# Patient Record
Sex: Male | Born: 1990 | Race: White | Hispanic: No | Marital: Single | Smoking: Never smoker
Health system: Southern US, Community
[De-identification: ages and names within clinical notes are randomized; demographics above are authoritative.]

## PROBLEM LIST (undated history)

## (undated) DIAGNOSIS — F909 Attention-deficit hyperactivity disorder, unspecified type: Secondary | ICD-10-CM

---
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
## 2012-10-10 DIAGNOSIS — I8390 Asymptomatic varicose veins of unspecified lower extremity: Secondary | ICD-10-CM | POA: Insufficient documentation

## 2012-11-01 ENCOUNTER — Emergency Department (HOSPITAL_BASED_OUTPATIENT_CLINIC_OR_DEPARTMENT_OTHER)
Admission: EM | Admit: 2012-11-01 | Discharge: 2012-11-02 | Disposition: A | Discharge status: Home / Self Care | Payer: BC Managed Care – PPO | Attending: Emergency Medicine | Admitting: Emergency Medicine

## 2012-11-01 ENCOUNTER — Encounter (HOSPITAL_BASED_OUTPATIENT_CLINIC_OR_DEPARTMENT_OTHER): Payer: Self-pay | Admitting: Emergency Medicine

## 2012-11-01 DIAGNOSIS — R059 Cough, unspecified: Secondary | ICD-10-CM | POA: Insufficient documentation

## 2012-11-01 DIAGNOSIS — R05 Cough: Secondary | ICD-10-CM | POA: Insufficient documentation

## 2012-11-01 DIAGNOSIS — Z8659 Personal history of other mental and behavioral disorders: Secondary | ICD-10-CM | POA: Insufficient documentation

## 2012-11-01 DIAGNOSIS — J029 Acute pharyngitis, unspecified: Secondary | ICD-10-CM | POA: Insufficient documentation

## 2012-11-01 DIAGNOSIS — J020 Streptococcal pharyngitis: Secondary | ICD-10-CM | POA: Insufficient documentation

## 2012-11-01 HISTORY — DX: Attention-deficit hyperactivity disorder, unspecified type: F90.9

## 2012-11-01 MED ORDER — IBUPROFEN 800 MG PO TABS
800.0000 mg | ORAL_TABLET | Freq: Once | ORAL | Status: AC
  Administered 2012-11-01: 800 mg via ORAL
  Filled 2012-11-01: qty 1

## 2012-11-01 NOTE — ED Notes (Signed)
Cough x6 days.  Fever x3 days.  Cough started while in [place].

## 2012-11-01 NOTE — ED Notes (Signed)
Pt reports non-productive cough x6 days, sore throat and fever x3 days, pt concerned d/t recently traveling to [place]. Pt has been using daquil and ibuprofen at home for fever w/o relief - last dose of ibuprofen was this a.m. Pt is A&Ox4 in no acute distress.

## 2012-11-02 ENCOUNTER — Emergency Department (HOSPITAL_BASED_OUTPATIENT_CLINIC_OR_DEPARTMENT_OTHER): Payer: BC Managed Care – PPO

## 2012-11-02 MED ORDER — BENZONATATE 100 MG PO CAPS: 100.0000 mg | ORAL_CAPSULE | Freq: Three times a day (TID) | ORAL | Status: DC

## 2012-11-02 MED ORDER — IBUPROFEN 800 MG PO TABS: 800.0000 mg | ORAL_TABLET | Freq: Three times a day (TID) | ORAL | Status: DC

## 2012-11-02 MED ORDER — PENICILLIN G BENZATHINE 1200000 UNIT/2ML IM SUSP
1.2000 10*6.[IU] | Freq: Once | INTRAMUSCULAR | Status: AC
  Administered 2012-11-02: 1.2 10*6.[IU] via INTRAMUSCULAR
  Filled 2012-11-02: qty 2

## 2012-11-02 NOTE — ED Notes (Signed)
Patient transported to X-ray 

## 2012-11-02 NOTE — ED Notes (Signed)
Pt ambulating independently w/ steady gait on d/c in no acute distress, A&Ox4. D/c instructions reviewed w/ pt and family - pt and family deny any further questions or concerns at present. Rx given x2  

## 2012-11-02 NOTE — ED Provider Notes (Signed)
  CSN: [id number]     Arrival date & time 11/01/12  2333 History     First MD Initiated Contact with Patient 11/01/12 2343     Chief Complaint  Patient presents with  . Cough  . Fever   (Consider location/radiation/quality/duration/timing/severity/associated sxs/prior Treatment) Patient is a 22 y.o. male presenting with cough and fever. The history is provided by the patient.  Cough Cough characteristics:  Non-productive Severity:  Mild Onset quality:  Gradual Timing:  Constant Progression:  Unchanged Chronicity:  New Context: sick contacts   Relieved by:  Nothing Worsened by:  Nothing tried Ineffective treatments:  None tried Associated symptoms: fever and sore throat   Sore throat:    Severity:  Moderate   Onset quality:  Gradual   Timing:  Constant   Progression:  Unchanged Risk factors: no recent infection   Fever Associated symptoms: cough and sore throat     Past Medical History  Diagnosis Date  . ADHD (attention deficit hyperactivity disorder)    History reviewed. No pertinent past surgical history. No family history on file. History  Substance Use Topics  . Smoking status: Never Smoker   . Smokeless tobacco: Not on file  . Alcohol Use: 0.0 oz/week     Comment: Beer couple times a week    Review of Systems  Constitutional: Positive for fever.  HENT: Positive for sore throat. Negative for drooling, trouble swallowing and voice change.   Respiratory: Positive for cough.   All other systems reviewed and are negative.    Allergies  Review of patient's allergies indicates no known allergies.  Home Medications  No current outpatient prescriptions on file. BP 116/87  Pulse 118  Temp(Src) 103 F (39.4 C) (Oral)  Resp 24  Ht 6\' 3"  (1.905 m)  Wt 185 lb (83.915 kg)  BMI 23.12 kg/m2  SpO2 99% Physical Exam  Constitutional: He is oriented to person, place, and time. He appears well-developed and well-nourished. No distress.  HENT:  Head: Normocephalic  and atraumatic.  Mouth/Throat: Oropharynx is clear and moist. No oropharyngeal exudate.  Eyes: Conjunctivae are normal. Pupils are equal, round, and reactive to light.  Neck: Normal range of motion. Neck supple.  Cardiovascular: Normal rate and regular rhythm.   Pulmonary/Chest: Effort normal and breath sounds normal. He has no wheezes. He has no rales.  Abdominal: Soft. Bowel sounds are normal. There is no tenderness. There is no rebound and no guarding.  Musculoskeletal: Normal range of motion.  Lymphadenopathy:    He has no cervical adenopathy.  Neurological: He is alert and oriented to person, place, and time.  Skin: Skin is warm and dry.  Psychiatric: He has a normal mood and affect.    ED Course   Procedures (including critical care time)  Labs Reviewed  RAPID STREP SCREEN - Abnormal; Notable for the following:    Streptococcus, Group A Screen (Direct) POSITIVE (*)    All other components within normal limits   No results found. No diagnosis found.  MDM  Will treat for strep  Briellah Baik K Jeury Mcnab-Rasch, MD 11/02/12 6578

## 2014-11-09 IMAGING — CR DG CHEST 2V
2 series · 2 of 2 positions shown · non-contrast
Comparison: 10/27/2005

CLINICAL DATA: Cough.  Fever [DATE].  Chest pain.  Recent return
[place] [facility] [employer].

CHEST - 2 VIEW

[w chest pa]
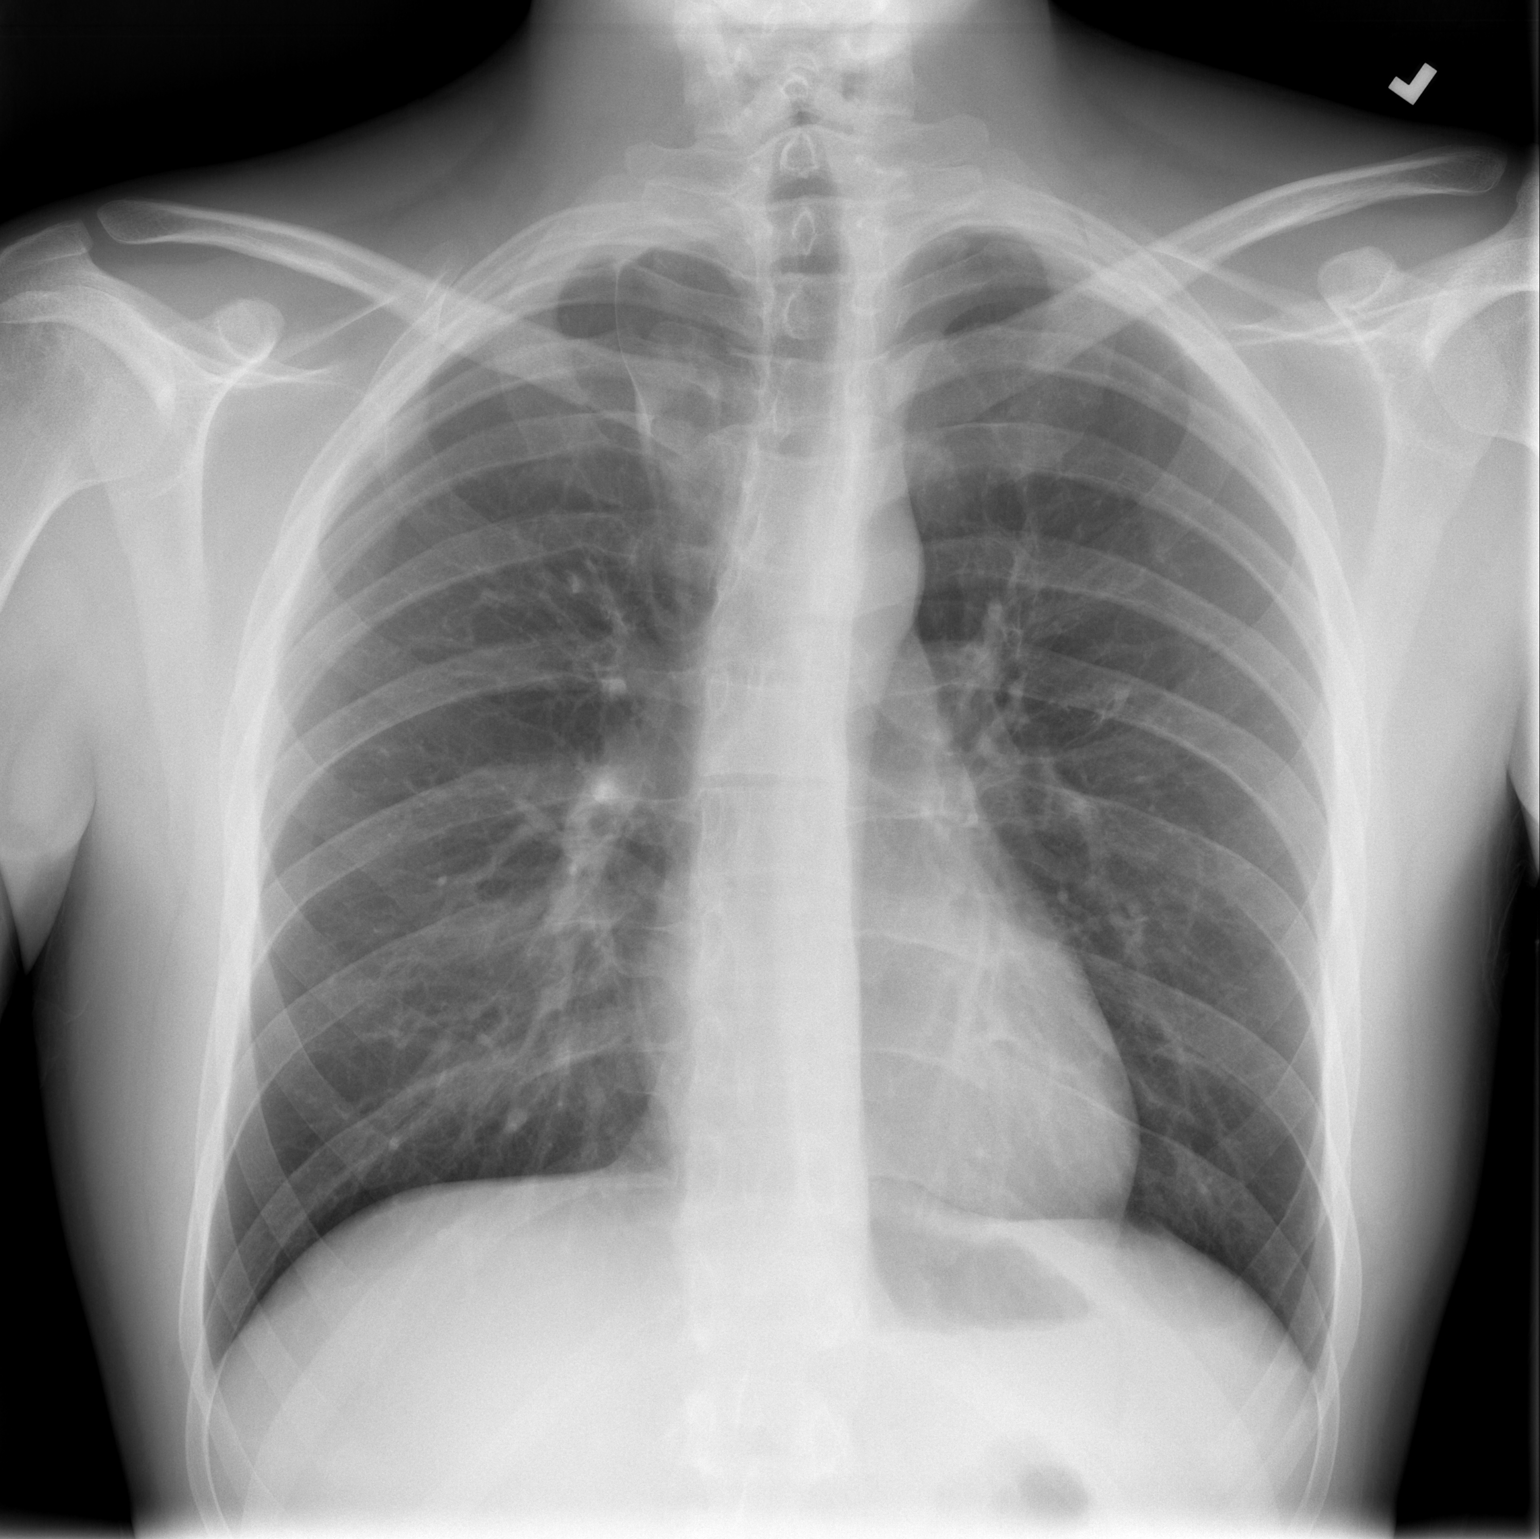

[w chest lat]
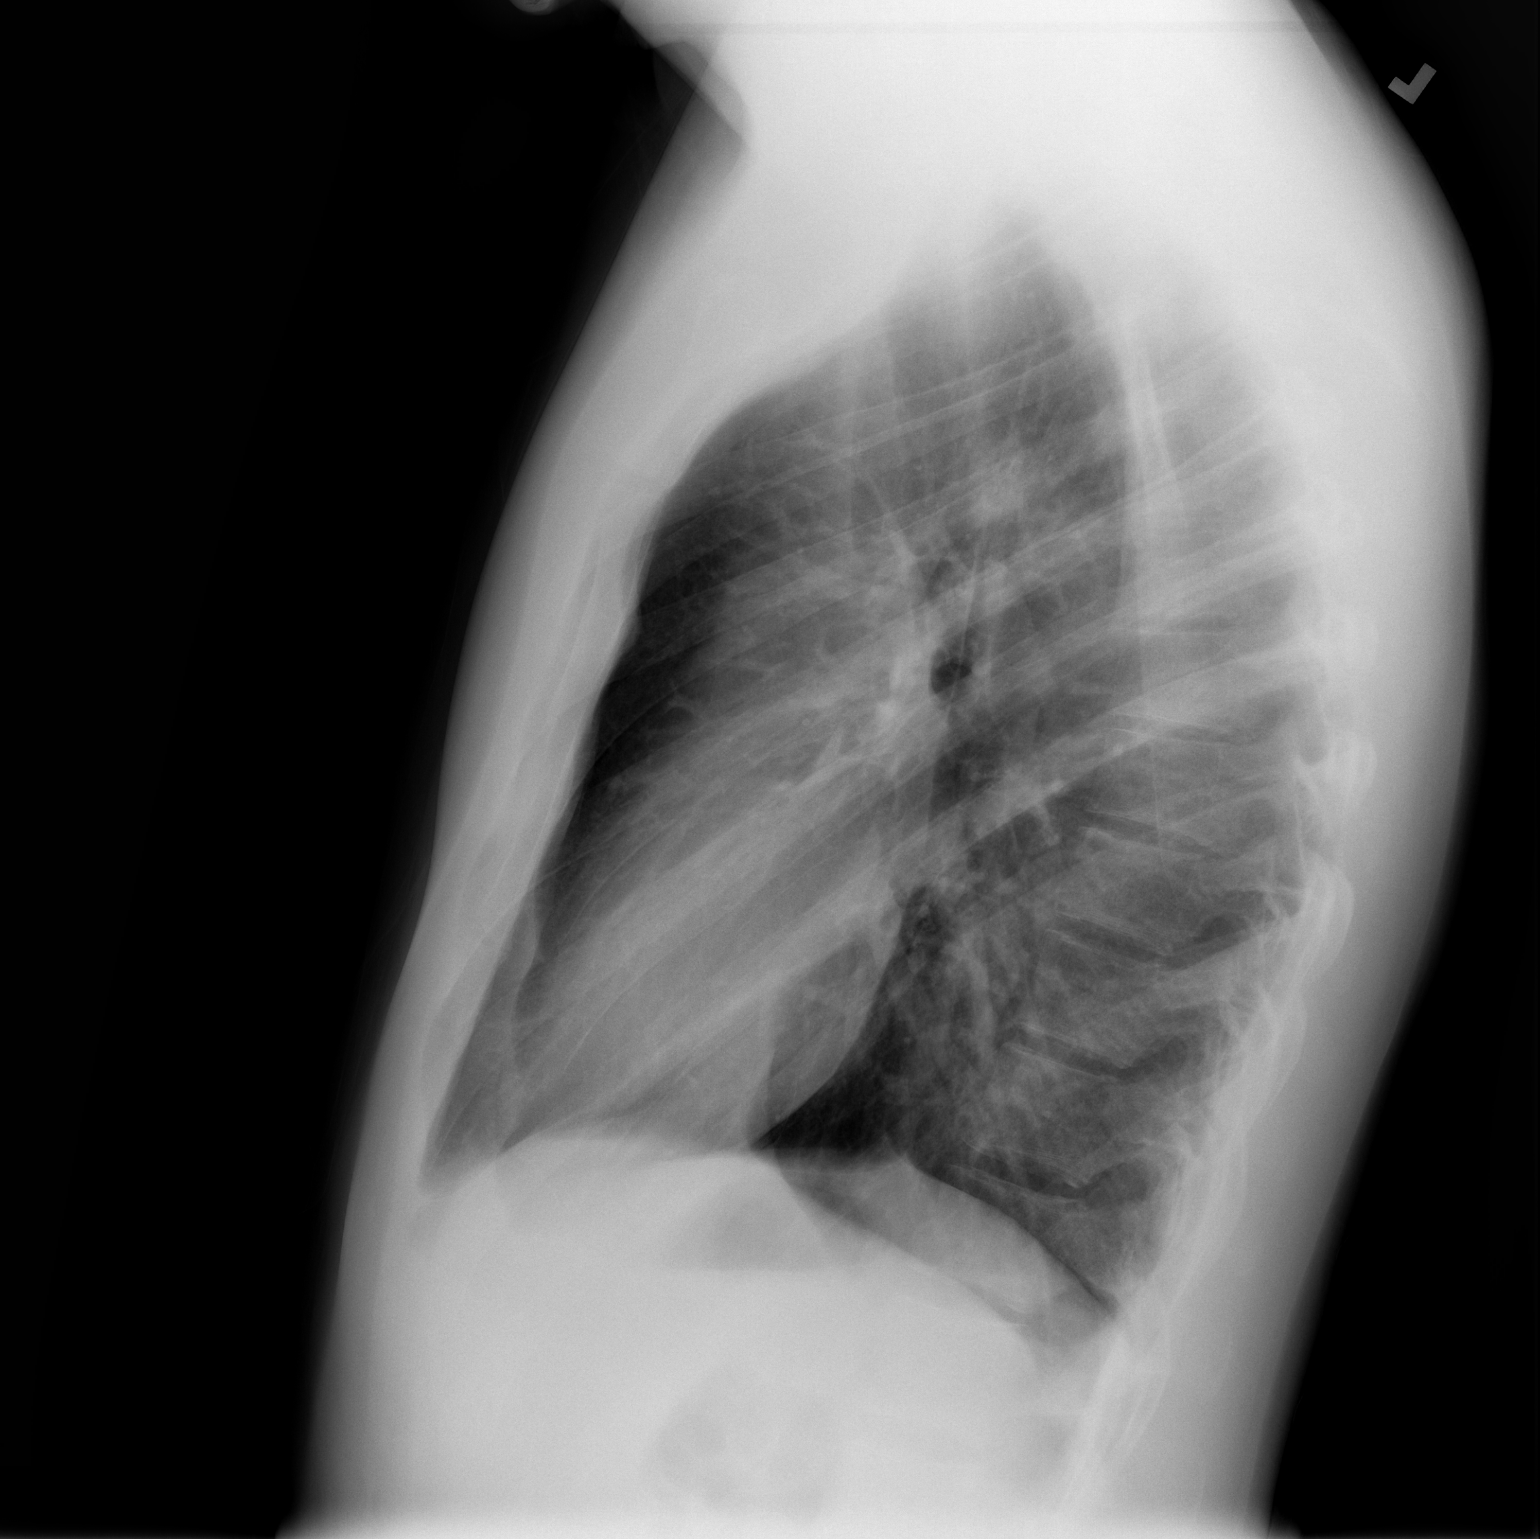

[2 of 2 positions shown; findings below may reference images not displayed]

FINDINGS: Mild hyperinflation.  Azygos lobe. The heart size and
pulmonary vascularity are normal. The lungs appear clear and
expanded without focal air space disease or consolidation. No
blunting of the costophrenic angles.  No pneumothorax.  Mediastinal
contours appear intact.  No significant changes since the previous
study.
IMPRESSION: No evidence of active pulmonary disease.

## 2016-06-16 ENCOUNTER — Encounter (HOSPITAL_COMMUNITY): Payer: Self-pay | Admitting: Psychiatry

## 2016-06-16 ENCOUNTER — Ambulatory Visit (INDEPENDENT_AMBULATORY_CARE_PROVIDER_SITE_OTHER): Payer: BLUE CROSS/BLUE SHIELD | Admitting: Psychiatry

## 2016-06-16 ENCOUNTER — Encounter (INDEPENDENT_AMBULATORY_CARE_PROVIDER_SITE_OTHER): Payer: Self-pay

## 2016-06-16 VITALS — BP 118/70 | HR 92 | Ht 74.0 in | Wt 200.2 lb

## 2016-06-16 DIAGNOSIS — Z79899 Other long term (current) drug therapy: Secondary | ICD-10-CM

## 2016-06-16 DIAGNOSIS — F900 Attention-deficit hyperactivity disorder, predominantly inattentive type: Secondary | ICD-10-CM | POA: Diagnosis not present

## 2016-06-16 DIAGNOSIS — F1290 Cannabis use, unspecified, uncomplicated: Secondary | ICD-10-CM

## 2016-06-16 DIAGNOSIS — F411 Generalized anxiety disorder: Secondary | ICD-10-CM

## 2016-06-16 MED ORDER — ESCITALOPRAM OXALATE 10 MG PO TABS: 10.0000 mg | ORAL_TABLET | Freq: Every day | ORAL | 0 refills | Status: DC

## 2016-06-16 MED ORDER — ESCITALOPRAM OXALATE 10 MG PO TABS: 10.0000 mg | ORAL_TABLET | Freq: Every day | ORAL | 1 refills | Status: AC

## 2016-06-16 MED ORDER — LISDEXAMFETAMINE DIMESYLATE 50 MG PO CAPS: 50.0000 mg | ORAL_CAPSULE | Freq: Every day | ORAL | 0 refills | Status: AC

## 2016-06-16 NOTE — Progress Notes (Signed)
Psychiatric Initial Adult Assessment   Patient Identification: Bobby Banks MRN:  [id number] Date of Evaluation:  06/16/2016 Referral Source: Self Chief Complaint:   Chief Complaint    Establish Care     Visit Diagnosis:    ICD-9-CM ICD-10-CM   1. GAD (generalized anxiety disorder) 300.02 F41.1 escitalopram (LEXAPRO) 10 MG tablet     DISCONTINUED: escitalopram (LEXAPRO) 10 MG tablet  2. Attention deficit hyperactivity disorder (ADHD), predominantly inattentive type 314.00 F90.0 lisdexamfetamine (VYVANSE) 50 MG capsule    History of Present Illness:  Bobby Banks is 26 year old Caucasian, employed unmarried man who is referred by himself for the management of his ADD and anxiety symptoms.  Patient endorsed that he is taking ADD medication for many years and now he wanted to establish care in [place].  He has seen in the past Dr. Dub MikesLugo who had prescribed him Zoloft, Lexapro, Lamictal, Adderall, Klonopin, Xanax and Vyvanse.  When he moved to [place] for his job his medications were continued by his primary care physician Dr. Selena BattenKim.  Patient mentioned 2 weeks ago his father died due to melanoma after suffering 6 months from the cancer.  Now he wants to spend more time in [place] closer to his mother.  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  He admitted lately feeling very anxious, racing thoughts, nervousness, poor sleep and endorse lately lack of interest and motivation to do things.  He waited about everything including finances, relationship, future, health.  Though he denies any suicidal thoughts or homicidal thought but endorsed sometimes hopeless and nervous.  In September 2017 he was in the emergency room because of chest pain and he had negative cardiac workup.  He is taking Xanax 1 mg as needed and Vyvanse 50 mg to help his focus and attention.  Lately he is been out of his Vyvanse and complaining of poor attention, concentration, difficulty  multitasking and difficulty to complete his tasks.  Patient admitted social drinking but denies any binge, intoxication, tremors, shakes or any blackouts.  He lives by himself.  His mother lives in [place] and he had a sister who lives in [place].  Patient denies any mood swing, anger, paranoia, hallucination, mania or any psychosis.  His appetite is fair.  He had gained weight in recent months.  He is open to try antianxiety medication.  He is not interested in counseling due to his busy schedule.  Associated Signs/Symptoms: Depression Symptoms:  depressed mood, insomnia, fatigue, difficulty concentrating, (Hypo) Manic Symptoms:  Distractibility, Anxiety Symptoms:  Excessive Worry, Psychotic Symptoms:  No psychotic symptoms PTSD Symptoms: Patient denies any history of physical, sexual, verbal or emotional abuse.  Past Psychiatric History: Patient denies any history of psychiatric inpatient treatment or any suicidal attempt.  He was diagnosed with ADD in his school and he remember having comprehensive psychological testing.  He started taking stimulants and his school age.  In the beginning he was given Adderall and then switched to Vyvanse.  He was seeing Dr. Dub MikesLugo and during his college he was prescribed Lamictal, Zoloft, Lexapro, Klonopin to help his anxiety irritability and mood swings.  He moved to [place] where his primary care physician switch from Klonopin to Xanax which she is taking 1-2 mg as needed.  Previous Psychotropic Medications: Yes   Substance Abuse History in the last 12 months:  No. Patient has history of smoking pot 4 years ago.  He also endorse social drinking with 3-4 drinks a week.  He denies any history of DUI, blackouts, tremors, shakes, withdrawal symptoms.  Consequences of Substance Abuse: Negative  Past Medical History:  Past Medical History:  Diagnosis Date  . ADHD (attention deficit hyperactivity disorder)    No past surgical history on  file.  Family Psychiatric History: Patient denies any family history of psychiatric illness.  Family History: No family history on file.  Social History:   Social History   Social History  . Marital status: Single    Spouse name: N/A  . Number of children: N/A  . Years of education: N/A   Social History Main Topics  . Smoking status: Never Smoker  . Smokeless tobacco: Not on file  . Alcohol use 0.0 oz/week     Comment: Beer couple times a week  . Drug use: Yes    Types: Marijuana  . Sexual activity: Not on file   Other Topics Concern  . Not on file   Social History Narrative  . No narrative on file    Additional Social History: Patient born and raised in [place].  He is single and has no children.  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  His father died 2 weeks ago due to melanoma.  He liked to stay more time with his mother in [place].  Allergies:  No Known Allergies  Metabolic Disorder Labs: No results found for: HGBA1C, MPG No results found for: PROLACTIN No results found for: CHOL, TRIG, HDL, CHOLHDL, VLDL, LDLCALC   Current Medications: Current Outpatient Prescriptions  Medication Sig Dispense Refill  . lisdexamfetamine (VYVANSE) 50 MG capsule Take 1 capsule (50 mg total) by mouth daily. 30 capsule 0  . escitalopram (LEXAPRO) 10 MG tablet Take 1 tablet (10 mg total) by mouth daily. 30 tablet 1   No current facility-administered medications for this visit.     Neurologic: Headache: No Seizure: No Paresthesias:No  Musculoskeletal: Strength & Muscle Tone: within normal limits Gait & Station: normal Patient leans: N/A  Psychiatric Specialty Exam: Review of Systems  Constitutional: Negative.   HENT: Negative.   Respiratory: Negative.   Cardiovascular: Negative.   Genitourinary: Negative.   Musculoskeletal: Negative.   Skin: Negative.   Neurological: Negative.   Psychiatric/Behavioral: Positive for  depression. The patient is nervous/anxious and has insomnia.     Blood pressure 118/70, pulse 92, height 6\' 2"  (1.88 m), weight 200 lb 3.2 oz (90.8 kg).Body mass index is 25.7 kg/m.  General Appearance: Casual  Eye Contact:  Fair  Speech:  Slow  Volume:  Normal  Mood:  Anxious and Depressed  Affect:  Constricted  Thought Process:  Goal Directed  Orientation:  Full (Time, Place, and Person)  Thought Content:  Logical and Rumination  Suicidal Thoughts:  No  Homicidal Thoughts:  No  Memory:  Immediate;   Good Recent;   Good Remote;   Good  Judgement:  Fair  Insight:  Good  Psychomotor Activity:  Normal  Concentration:  Concentration: Fair and Attention Span: Fair  Recall:  Good  Fund of Knowledge:Good  Language: Fair  Akathisia:  No  Handed:  Right  AIMS (if indicated):  0  Assets:  Communication Skills Desire for Improvement Financial Resources/Insurance Housing Intimacy Physical Health Resilience Social Support Talents/Skills Transportation Vocational/Educational  ADL's:  Intact  Cognition: WNL  Sleep:  Fair    Assessment: Attention deficit disorder, inattentive type.  Generalized anxiety disorder  Plan: I review his symptoms, history, current medication and psychosocial stressors.  Recommended to discontinue Xanax and try Lexapro which had helped him in the past.  We will start Lexapro 10 mg daily however I recommended to take half tablet for a few days to avoid GI side effects.  He will continue Vyvanse 50 mg daily.  Discuss interaction with alcohol and psychotropic medication.  Patient is not interested in counseling at this time.  We will get his consent to get records from his primary care physician.  I will see him again in 4-6 weeks. Discussed medication side effects and benefits.  Recommended to call us back if there is any question, concern or worsening of the symptoms.  Discuss safety plan that anytime having active suicidal thoughts or homicidal thoughts and  she need to call 911 or go to the local emergency room.  Trinidad Ingle T., MD 3/8/20189:54 AM
